# Patient Record
Sex: Female | Born: 2000 | Race: White | Hispanic: No | Marital: Single
Health system: Southern US, Community
[De-identification: ages and names within clinical notes are randomized; demographics above are authoritative.]

---

## 2019-01-09 ENCOUNTER — Other Ambulatory Visit: Payer: Self-pay

## 2019-01-09 DIAGNOSIS — Z20822 Contact with and (suspected) exposure to covid-19: Secondary | ICD-10-CM

## 2019-01-10 ENCOUNTER — Telehealth: Payer: Self-pay

## 2019-01-10 LAB — NOVEL CORONAVIRUS, NAA: SARS-CoV-2, NAA: NOT DETECTED

## 2019-01-10 NOTE — Telephone Encounter (Signed)
Patient was transferred from [facility] for MyChart set up assistance - DOB/Address verified - assisted w/MyChart setup, no further questions.

## 2019-01-18 ENCOUNTER — Emergency Department: Payer: PRIVATE HEALTH INSURANCE

## 2019-01-18 ENCOUNTER — Other Ambulatory Visit: Payer: Self-pay

## 2019-01-18 ENCOUNTER — Emergency Department
Admission: EM | Admit: 2019-01-18 | Discharge: 2019-01-19 | Disposition: A | Discharge status: Home / Self Care | Payer: PRIVATE HEALTH INSURANCE | Attending: Emergency Medicine | Admitting: Emergency Medicine

## 2019-01-18 ENCOUNTER — Encounter: Payer: Self-pay | Admitting: Emergency Medicine

## 2019-01-18 DIAGNOSIS — R509 Fever, unspecified: Secondary | ICD-10-CM | POA: Diagnosis present

## 2019-01-18 DIAGNOSIS — Z20828 Contact with and (suspected) exposure to other viral communicable diseases: Secondary | ICD-10-CM | POA: Diagnosis not present

## 2019-01-18 DIAGNOSIS — J029 Acute pharyngitis, unspecified: Secondary | ICD-10-CM | POA: Diagnosis not present

## 2019-01-18 LAB — URINALYSIS, COMPLETE (UACMP) WITH MICROSCOPIC
Bacteria, UA: NONE SEEN
Bilirubin Urine: NEGATIVE
Glucose, UA: NEGATIVE mg/dL
Hgb urine dipstick: NEGATIVE
Ketones, ur: 20 mg/dL — AB
Leukocytes,Ua: NEGATIVE
Nitrite: NEGATIVE
Protein, ur: >=300 mg/dL — AB
Specific Gravity, Urine: 1.041 — ABNORMAL HIGH (ref 1.005–1.030)
WBC, UA: >50 WBC/hpf — ABNORMAL HIGH (ref 0–5)
pH: 5 (ref 5.0–8.0)

## 2019-01-18 LAB — COMPREHENSIVE METABOLIC PANEL
ALT: 14 U/L (ref 0–44)
AST: 18 U/L (ref 15–41)
Albumin: 4 g/dL (ref 3.5–5.0)
Alkaline Phosphatase: 55 U/L (ref 38–126)
Anion gap: 13 (ref 5–15)
BUN: 10 mg/dL (ref 6–20)
CO2: 24 mmol/L (ref 22–32)
Calcium: 9.6 mg/dL (ref 8.9–10.3)
Chloride: 97 mmol/L — ABNORMAL LOW (ref 98–111)
Creatinine, Ser: 0.81 mg/dL (ref 0.44–1.00)
GFR calc Af Amer: >60 mL/min (ref >60)
GFR calc non Af Amer: >60 mL/min (ref >60)
Glucose, Bld: 105 mg/dL — ABNORMAL HIGH (ref 70–99)
Potassium: 4.2 mmol/L (ref 3.5–5.1)
Sodium: 134 mmol/L — ABNORMAL LOW (ref 135–145)
Total Bilirubin: 0.7 mg/dL (ref 0.3–1.2)
Total Protein: 7.6 g/dL (ref 6.5–8.1)

## 2019-01-18 LAB — CBC WITH DIFFERENTIAL/PLATELET
Abs Immature Granulocytes: 0.13 10*3/uL — ABNORMAL HIGH (ref 0.00–0.07)
Basophils Absolute: 0.1 10*3/uL (ref 0.0–0.1)
Basophils Relative: 0 %
Eosinophils Absolute: 0 10*3/uL (ref 0.0–0.5)
Eosinophils Relative: 0 %
HCT: 41.4 % (ref 36.0–46.0)
Hemoglobin: 14 g/dL (ref 12.0–15.0)
Immature Granulocytes: 1 %
Lymphocytes Relative: 6 %
Lymphs Abs: 1.2 10*3/uL (ref 0.7–4.0)
MCH: 29.5 pg (ref 26.0–34.0)
MCHC: 33.8 g/dL (ref 30.0–36.0)
MCV: 87.2 fL (ref 80.0–100.0)
Monocytes Absolute: 2.1 10*3/uL — ABNORMAL HIGH (ref 0.1–1.0)
Monocytes Relative: 10 %
Neutro Abs: 17.5 10*3/uL — ABNORMAL HIGH (ref 1.7–7.7)
Neutrophils Relative %: 83 %
Platelets: 281 10*3/uL (ref 150–400)
RBC: 4.75 MIL/uL (ref 3.87–5.11)
RDW: 12.4 % (ref 11.5–15.5)
WBC: 21 10*3/uL — ABNORMAL HIGH (ref 4.0–10.5)
nRBC: 0 % (ref 0.0–0.2)

## 2019-01-18 LAB — POCT PREGNANCY, URINE: Preg Test, Ur: NEGATIVE

## 2019-01-18 LAB — GROUP A STREP BY PCR: Group A Strep by PCR: NOT DETECTED

## 2019-01-18 MED ORDER — DEXAMETHASONE 10 MG/ML FOR PEDIATRIC ORAL USE
10.0000 mg | Freq: Once | INTRAMUSCULAR | Status: AC
  Administered 2019-01-18: 23:00:00 10 mg via ORAL
  Filled 2019-01-18: qty 1

## 2019-01-18 MED ORDER — ACETAMINOPHEN 325 MG PO TABS
650.0000 mg | ORAL_TABLET | Freq: Once | ORAL | Status: AC
  Administered 2019-01-18: 650 mg via ORAL
  Filled 2019-01-18: qty 2

## 2019-01-18 MED ORDER — SODIUM CHLORIDE 0.9 % IV BOLUS
1000.0000 mL | Freq: Once | INTRAVENOUS | Status: AC
  Administered 2019-01-18: 23:00:00 1000 mL via INTRAVENOUS

## 2019-01-18 MED ORDER — AMOXICILLIN 875 MG PO TABS: 875.0000 mg | ORAL_TABLET | Freq: Two times a day (BID) | ORAL | 0 refills | Status: AC

## 2019-01-18 MED ORDER — CEFTRIAXONE SODIUM 1 G IJ SOLR
1.0000 g | Freq: Once | INTRAMUSCULAR | Status: AC
  Administered 2019-01-18: 23:00:00 1 g via INTRAMUSCULAR
  Filled 2019-01-18: qty 10

## 2019-01-18 MED ORDER — AZITHROMYCIN 500 MG PO TABS
1000.0000 mg | ORAL_TABLET | Freq: Once | ORAL | Status: AC
  Administered 2019-01-18: 23:00:00 1000 mg via ORAL
  Filled 2019-01-18: qty 2

## 2019-01-18 NOTE — ED Triage Notes (Signed)
First RN Note: pt presents to ED via [facility] with c/o fever and sore throat. Per EMS pt had negative Covid test 5 days ago, one again today with pending results. EMS reports fever at home 103, upon their arrival temp 102. Pt ambulatory from  The EMS bay to the lobby at this time.

## 2019-01-18 NOTE — ED Notes (Signed)
Patient reports sore throat, painful when throat swabbed. Tonsils appear inflammed

## 2019-01-18 NOTE — ED Provider Notes (Signed)
[facility] Emergency Department Provider Note  ____________________________________________  Time seen: Approximately 11:30 PM  I have reviewed the triage vital signs and the nursing notes.   HISTORY  Chief Complaint Fever    HPI Jamie Moss is a 18 y.o. female presents to the emergency department with low-grade fever, headache, pharyngitis and malaise for the past 3 days.  No associated rhinorrhea, nasal congestion or nonproductive cough.  Patient denies emesis or diarrhea.  She has been maintaining her own secretions at home.  Patient does state that she had recent unprotected oral sex approximately week ago.  No other sick contacts in her dorm with similar symptoms.  No other alleviating measures have been attempted.        History reviewed. No pertinent past medical history.  There are no active problems to display for this patient.   History reviewed. No pertinent surgical history.  Prior to Admission medications   Medication Sig Start Date End Date Taking? Authorizing Provider  amoxicillin (AMOXIL) 875 MG tablet Take 1 tablet (875 mg total) by mouth 2 (two) times daily for 10 days. 01/18/19 01/28/19  Orvil Feil, PA-C    Allergies Patient has no known allergies.  No family history on file.  Social History Social History   Tobacco Use  . Smoking status: Not on file  Substance Use Topics  . Alcohol use: Not on file  . Drug use: Not on file     Review of Systems  Constitutional: Patient has fever.  Eyes: No visual changes. No discharge ENT: Patient has pharyngitis.  Cardiovascular: no chest pain. Respiratory: no cough. No SOB. Gastrointestinal: No abdominal pain.  No nausea, no vomiting.  No diarrhea.  No constipation. Genitourinary: Negative for dysuria. No hematuria Musculoskeletal: Negative for musculoskeletal pain. Skin: Negative for rash, abrasions, lacerations, ecchymosis. Neurological: Negative for headaches,  focal weakness or numbness.   ____________________________________________   PHYSICAL EXAM:  VITAL SIGNS: ED Triage Vitals  Enc Vitals Group     BP 01/18/19 2157 (!) 131/100     Pulse Rate 01/18/19 2157 (!) 122     Resp 01/18/19 2157 20     Temp 01/18/19 2157 100.2 F (37.9 C)     Temp Source 01/18/19 2157 Oral     SpO2 01/18/19 2157 97 %     Weight 01/18/19 2157 145 lb (65.8 kg)     Height 01/18/19 2157 5\' 3"  (1.6 m)     Head Circumference --      Peak Flow --      Pain Score 01/18/19 2156 0     Pain Loc --      Pain Edu? --      Excl. in GC? --      Constitutional: Alert and oriented. Well appearing and in no acute distress. Eyes: Conjunctivae are normal. PERRL. EOMI. Head: Atraumatic. ENT:      Nose: No congestion/rhinnorhea.      Mouth/Throat: Mucous membranes are moist.  Posterior pharynx is erythematous with symmetrical tonsillar hypertrophy and exudate.  Uvula is midline. Neck: No stridor.  No cervical spine tenderness to palpation. Hematological/Lymphatic/Immunilogical: Palpable cervical lymphadenopathy.  Cardiovascular: Normal rate, regular rhythm. Normal S1 and S2.  Good peripheral circulation. Respiratory: Normal respiratory effort without tachypnea or retractions. Lungs CTAB. Good air entry to the bases with no decreased or absent breath sounds. Gastrointestinal: Bowel sounds 4 quadrants. Soft and nontender to palpation. No guarding or rigidity. No palpable masses. No distention. No CVA tenderness. Musculoskeletal: Full  range of motion to all extremities. No gross deformities appreciated. Neurologic:  Normal speech and language. No gross focal neurologic deficits are appreciated.  Skin:  Skin is warm, dry and intact. No rash noted. Psychiatric: Mood and affect are normal. Speech and behavior are normal. Patient exhibits appropriate insight and judgement.   ____________________________________________   LABS (all labs ordered are listed, but only abnormal  results are displayed)  Labs Reviewed  URINALYSIS, COMPLETE (UACMP) WITH MICROSCOPIC - Abnormal; Notable for the following components:      Result Value   Color, Urine AMBER (*)    APPearance CLOUDY (*)    Specific Gravity, Urine 1.041 (*)    Ketones, ur 20 (*)    Protein, ur >=300 (*)    WBC, UA >50 (*)    Non Squamous Epithelial PRESENT (*)    All other components within normal limits  CBC WITH DIFFERENTIAL/PLATELET - Abnormal; Notable for the following components:   WBC 21.0 (*)    Neutro Abs 17.5 (*)    Monocytes Absolute 2.1 (*)    Abs Immature Granulocytes 0.13 (*)    All other components within normal limits  COMPREHENSIVE METABOLIC PANEL - Abnormal; Notable for the following components:   Sodium 134 (*)    Chloride 97 (*)    Glucose, Bld 105 (*)    All other components within normal limits  GROUP A STREP BY PCR  SARS CORONAVIRUS 2 (TAT 6-24 HRS)  GC/CHLAMYDIA PROBE AMP  POCT PREGNANCY, URINE   ____________________________________________  EKG   ____________________________________________  RADIOLOGY I personally viewed and evaluated these images as part of my medical decision making, as well as reviewing the written report by the radiologist.  Dg Chest 1 View  Result Date: 01/18/2019 CLINICAL DATA:  Fever and sore throat for 2 days EXAM: CHEST  1 VIEW COMPARISON:  None. FINDINGS: The heart size and mediastinal contours are within normal limits. Both lungs are clear. The visualized skeletal structures are unremarkable. IMPRESSION: No active disease. Electronically Signed   By: Alcide CleverMark  Lukens M.D.   On: 01/18/2019 22:26    ____________________________________________    PROCEDURES  Procedure(s) performed:    Procedures    Medications  cefTRIAXone (ROCEPHIN) injection 1 g (1 g Intramuscular Given 01/18/19 2255)  azithromycin (ZITHROMAX) tablet 1,000 mg (1,000 mg Oral Given 01/18/19 2255)  dexamethasone (DECADRON) 10 MG/ML injection for Pediatric ORAL  use 10 mg (10 mg Oral Given 01/18/19 2255)  acetaminophen (TYLENOL) tablet 650 mg (650 mg Oral Given 01/18/19 2255)  sodium chloride 0.9 % bolus 1,000 mL (1,000 mLs Intravenous New Bag/Given 01/18/19 2321)     ____________________________________________   INITIAL IMPRESSION / ASSESSMENT AND PLAN / ED COURSE  Pertinent labs & imaging results that were available during my care of the patient were reviewed by me and considered in my medical decision making (see chart for details).  Review of the [place] CSRS was performed in accordance of the [facility] prior to dispensing any controlled drugs.  Clinical Course as of Jan 18 2344  Thu Jan 18, 2019  2309 Color, Urine(!): AMBER [JW]  2309 Color, Urine(!): AMBER [JW]  2312 Urinalysis, Complete w Microscopic(!) [JE]    Clinical Course User Index [JE] Clayborn Heronhrisman, Jessie A, Student-PA [JW] Orvil FeilWoods, Juliana Boling M, PA-C          Assessment and Plan: Pharyngitis: 18 year old female presents to the emergency department with pharyngitis and headache for the past 3 to 4 days.  Patient was hypertensive, had low-grade fever and was  tachycardic at triage.  Posterior pharynx appeared erythematous with tonsillar exudate.  Differential diagnosis included group A strep pharyngitis, gonorrhea/chlamydia of the throat, cystitis, dehydration, COVID-19...  Patient tested negative for group A strep.  Pregnancy testing was negative.  Patient had leukocytosis on CBC, WBC 21.  Urinalysis was concerning for dehydration and proteinuria, greater than or equal to 300 mg/dL.  BUN and creatinine were within reference range on CMP.  I did treat patient empirically with amoxicillin at discharge.  She received Rocephin and azithromycin in the emergency department given recent history of unprotected oral sex.  She was advised to follow-up with primary care if symptoms persist.  All patient questions were answered.      ____________________________________________  FINAL  CLINICAL IMPRESSION(S) / ED DIAGNOSES  Final diagnoses:  Pharyngitis, unspecified etiology      NEW MEDICATIONS STARTED DURING THIS VISIT:  ED Discharge Orders         Ordered    amoxicillin (AMOXIL) 875 MG tablet  2 times daily     01/18/19 2341              This chart was dictated using voice recognition software/Dragon. Despite best efforts to proofread, errors can occur which can change the meaning. Any change was purely unintentional.    Lannie Fields, PA-C 01/18/19 2345    Harvest Dark, MD 01/19/19 [phone]

## 2019-01-18 NOTE — ED Triage Notes (Signed)
Patient ambulatory to triage with steady gait, without difficulty or distress noted; pt reports here for fever since yesterday; denies any accomp symptoms

## 2019-01-19 LAB — SARS CORONAVIRUS 2 (TAT 6-24 HRS): SARS Coronavirus 2: NEGATIVE

## 2019-04-18 ENCOUNTER — Ambulatory Visit: Payer: PRIVATE HEALTH INSURANCE | Attending: Internal Medicine

## 2019-04-18 DIAGNOSIS — Z20822 Contact with and (suspected) exposure to covid-19: Secondary | ICD-10-CM

## 2019-04-19 LAB — NOVEL CORONAVIRUS, NAA: SARS-CoV-2, NAA: NOT DETECTED

## 2021-05-30 IMAGING — DX DG CHEST 1V
1 series · 1 of 1 positions shown · non-contrast
Comparison: None.

CLINICAL DATA: Fever and sore throat for 2 days

EXAM:
CHEST  1 VIEW

[chest ap]
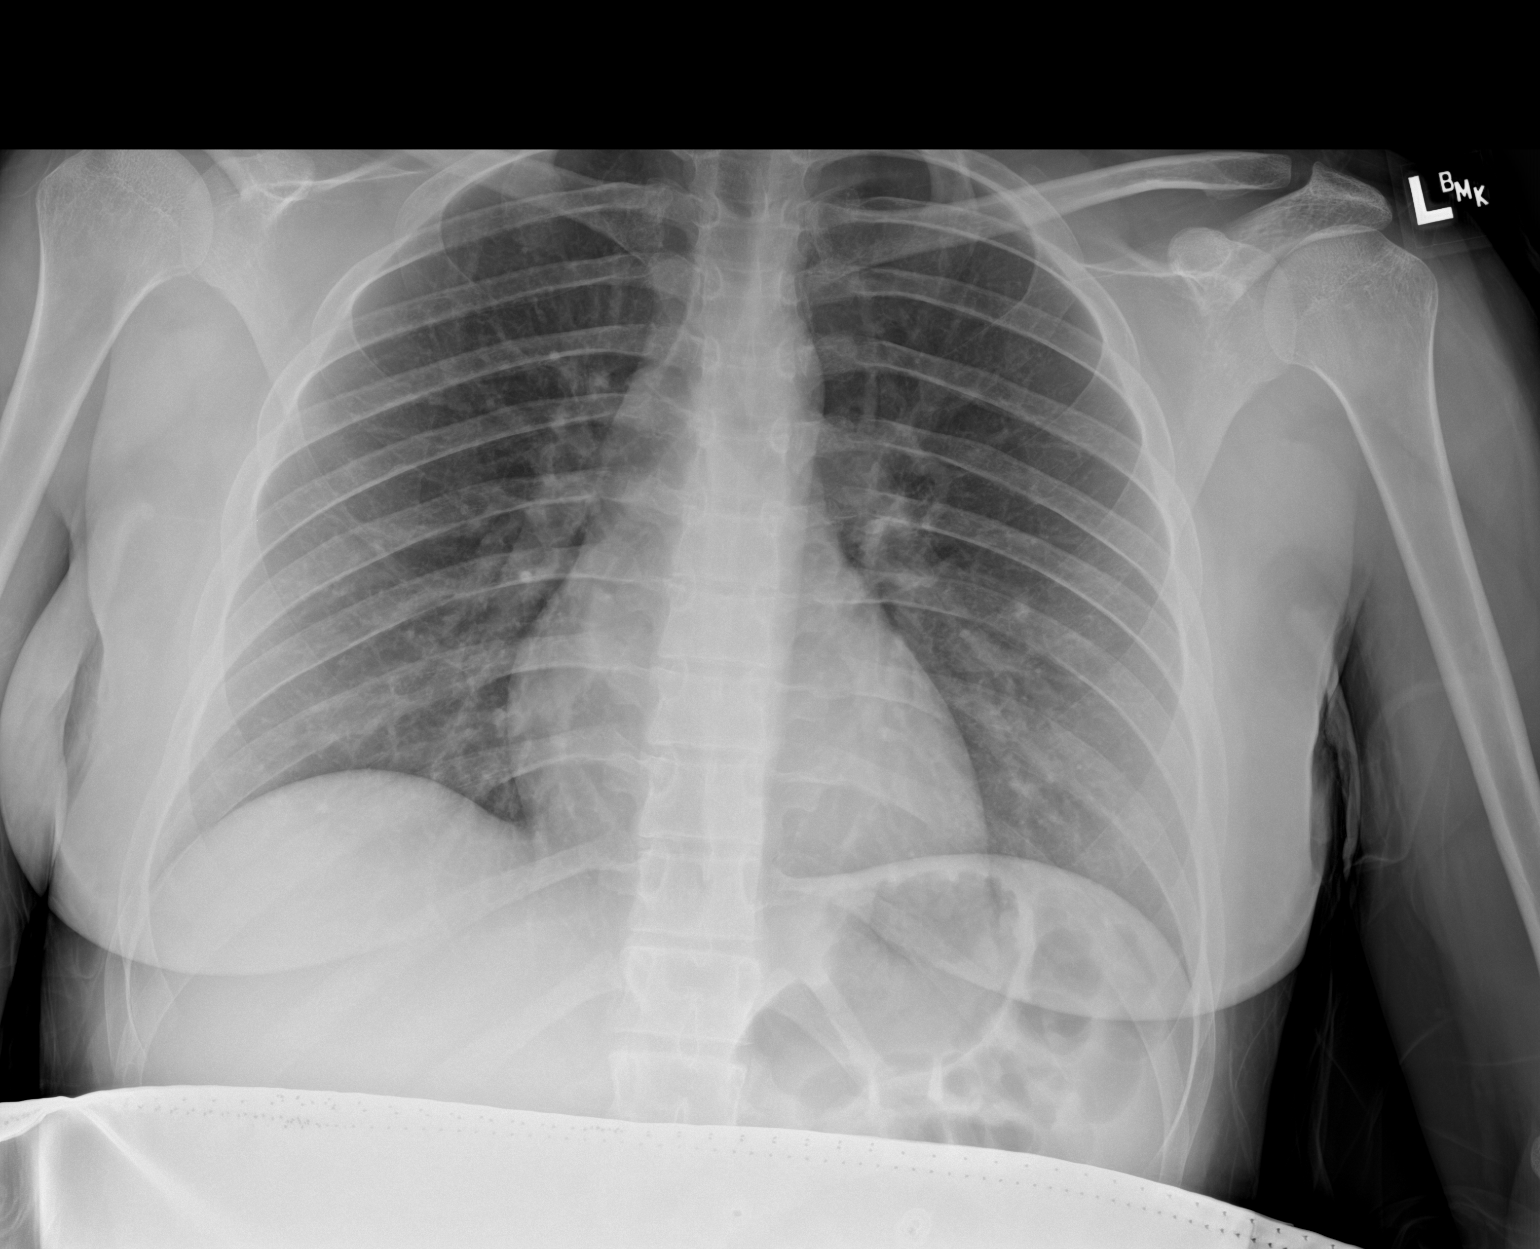

[1 of 1 positions shown; findings below may reference images not displayed]

FINDINGS: The heart size and mediastinal contours are within normal limits.
Both lungs are clear. The visualized skeletal structures are
unremarkable.
IMPRESSION: No active disease.
